# Patient Record
Sex: Female | Born: 1980 | Race: Black or African American | Hispanic: No | Smoking: Never smoker
Health system: Southern US, Community
[De-identification: ages and names within clinical notes are randomized; demographics above are authoritative.]

---

## 2015-07-20 ENCOUNTER — Ambulatory Visit: Payer: Self-pay | Admitting: Cardiology

## 2020-09-27 ENCOUNTER — Encounter (HOSPITAL_COMMUNITY): Payer: Self-pay

## 2020-09-27 ENCOUNTER — Emergency Department (HOSPITAL_COMMUNITY): Payer: Commercial Managed Care - PPO

## 2020-09-27 ENCOUNTER — Other Ambulatory Visit: Payer: Self-pay

## 2020-09-27 ENCOUNTER — Emergency Department (HOSPITAL_COMMUNITY)
Admission: EM | Admit: 2020-09-27 | Discharge: 2020-09-27 | Disposition: A | Discharge status: Home / Self Care | Payer: Commercial Managed Care - PPO | Attending: Emergency Medicine | Admitting: Emergency Medicine

## 2020-09-27 DIAGNOSIS — R599 Enlarged lymph nodes, unspecified: Secondary | ICD-10-CM | POA: Diagnosis not present

## 2020-09-27 DIAGNOSIS — J029 Acute pharyngitis, unspecified: Secondary | ICD-10-CM | POA: Diagnosis present

## 2020-09-27 DIAGNOSIS — J36 Peritonsillar abscess: Secondary | ICD-10-CM | POA: Insufficient documentation

## 2020-09-27 LAB — CBC WITH DIFFERENTIAL/PLATELET
Abs Immature Granulocytes: 0.05 10*3/uL (ref 0.00–0.07)
Basophils Absolute: 0 10*3/uL (ref 0.0–0.1)
Basophils Relative: 0 %
Eosinophils Absolute: 0 10*3/uL (ref 0.0–0.5)
Eosinophils Relative: 0 %
HCT: 40.2 % (ref 36.0–46.0)
Hemoglobin: 12.8 g/dL (ref 12.0–15.0)
Immature Granulocytes: 1 %
Lymphocytes Relative: 8 %
Lymphs Abs: 0.6 10*3/uL — ABNORMAL LOW (ref 0.7–4.0)
MCH: 25.3 pg — ABNORMAL LOW (ref 26.0–34.0)
MCHC: 31.8 g/dL (ref 30.0–36.0)
MCV: 79.4 fL — ABNORMAL LOW (ref 80.0–100.0)
Monocytes Absolute: 0.5 10*3/uL (ref 0.1–1.0)
Monocytes Relative: 6 %
Neutro Abs: 7.1 10*3/uL (ref 1.7–7.7)
Neutrophils Relative %: 85 %
Platelets: 321 10*3/uL (ref 150–400)
RBC: 5.06 MIL/uL (ref 3.87–5.11)
RDW: 14.1 % (ref 11.5–15.5)
WBC: 8.4 10*3/uL (ref 4.0–10.5)
nRBC: 0 % (ref 0.0–0.2)

## 2020-09-27 LAB — BASIC METABOLIC PANEL
Anion gap: 9 (ref 5–15)
BUN: 10 mg/dL (ref 6–20)
CO2: 20 mmol/L — ABNORMAL LOW (ref 22–32)
Calcium: 8.4 mg/dL — ABNORMAL LOW (ref 8.9–10.3)
Chloride: 103 mmol/L (ref 98–111)
Creatinine, Ser: 0.98 mg/dL (ref 0.44–1.00)
GFR, Estimated: >60 mL/min (ref >60)
Glucose, Bld: 95 mg/dL (ref 70–99)
Potassium: 4.2 mmol/L (ref 3.5–5.1)
Sodium: 132 mmol/L — ABNORMAL LOW (ref 135–145)

## 2020-09-27 LAB — GROUP A STREP BY PCR: Group A Strep by PCR: DETECTED — AB

## 2020-09-27 LAB — HCG, SERUM, QUALITATIVE: Preg, Serum: NEGATIVE

## 2020-09-27 MED ORDER — DEXAMETHASONE SODIUM PHOSPHATE 10 MG/ML IJ SOLN
10.0000 mg | Freq: Once | INTRAMUSCULAR | Status: AC
  Administered 2020-09-27: 10 mg via INTRAVENOUS
  Filled 2020-09-27: qty 1

## 2020-09-27 MED ORDER — IBUPROFEN 800 MG PO TABS
800.0000 mg | ORAL_TABLET | Freq: Once | ORAL | Status: AC
  Administered 2020-09-27: 800 mg via ORAL
  Filled 2020-09-27: qty 1

## 2020-09-27 MED ORDER — CLINDAMYCIN PHOSPHATE 600 MG/50ML IV SOLN
600.0000 mg | Freq: Once | INTRAVENOUS | Status: AC
  Administered 2020-09-27: 600 mg via INTRAVENOUS
  Filled 2020-09-27: qty 50

## 2020-09-27 MED ORDER — IOHEXOL 300 MG/ML  SOLN
75.0000 mL | Freq: Once | INTRAMUSCULAR | Status: AC | PRN
  Administered 2020-09-27: 75 mL via INTRAVENOUS

## 2020-09-27 MED ORDER — CLINDAMYCIN HCL 300 MG PO CAPS: 300.0000 mg | ORAL_CAPSULE | Freq: Three times a day (TID) | ORAL | 0 refills | Status: AC

## 2020-09-27 NOTE — Discharge Instructions (Addendum)
You were seen in the emergency department for your sore throat.  Your CT scan was very reassuring.  It appears you have a bacterial throat infection causing your sore throat.  You administered your first dose of antibiotics in the emergency department, and you were given a prescription for antibiotics. Please take the antibiotic prescription fully.   Please follow up with the ear nose and throat doctor listed below within 5-7 days for re-evaluation of your symptoms.  Please return to the emergency department for any new or worsening symptoms.

## 2020-09-27 NOTE — ED Provider Notes (Signed)
[facility] EMERGENCY DEPARTMENT Provider Note   CSN: [id number] Arrival date & time: 09/27/20  1653     History Chief Complaint  Patient presents with  . Facial Swelling    Tiffany Lloyd is a 40 y.o. female.  HPI   40 year old female presenting the emergency department today for evaluation of sore throat.  For the last 2 days has had pain to the left side of her throat and later developed some swelling to the left side of her neck.  She reports associated fevers.  History reviewed. No pertinent past medical history.  There are no problems to display for this patient.   History reviewed. No pertinent surgical history.   OB History   No obstetric history on file.     No family history on file.  Social History   Tobacco Use  . Smoking status: Never Smoker  . Smokeless tobacco: Never Used  Substance Use Topics  . Alcohol use: Yes    Alcohol/week: 3.0 standard drinks    Types: 3 Glasses of wine per week  . Drug use: Never    Home Medications Prior to Admission medications   Medication Sig Start Date End Date Taking? Authorizing Provider  clindamycin (CLEOCIN) 300 MG capsule Take 1 capsule (300 mg total) by mouth 3 (three) times daily for 7 days. 09/27/20 10/04/20 Yes Brit Wernette S, PA-C    Allergies    Amoxicillin  Review of Systems   Review of Systems  Constitutional: Positive for fever.  HENT: Positive for sore throat.   Eyes: Negative for visual disturbance.  Respiratory: Negative for shortness of breath.   Cardiovascular: Negative for chest pain.  Gastrointestinal: Negative for abdominal pain, diarrhea, nausea and vomiting.  Genitourinary: Negative for pelvic pain.  Musculoskeletal: Negative for back pain.  Neurological: Negative for headaches.    Physical Exam Updated Vital Signs BP (!) 142/88 (BP Location: Left Arm)   Pulse 98   Temp 99.5 F (37.5 C) (Oral)   Resp 16   Ht 5\' 2"  (1.575 m)   Wt 72.6 kg   LMP 09/23/2020   SpO2 100%   BMI  29.26 kg/m   Physical Exam Vitals and nursing note reviewed.  Constitutional:      General: She is not in acute distress.    Appearance: She is well-developed.  HENT:     Head: Normocephalic and atraumatic.     Mouth/Throat:     Pharynx: Uvula midline. Pharyngeal swelling and posterior oropharyngeal erythema present. No oropharyngeal exudate.     Tonsils: No tonsillar exudate. 3+ on the right. 3+ on the left.  Eyes:     Conjunctiva/sclera: Conjunctivae normal.  Cardiovascular:     Rate and Rhythm: Normal rate and regular rhythm.     Heart sounds: No murmur heard.   Pulmonary:     Effort: Pulmonary effort is normal. No respiratory distress.     Breath sounds: Normal breath sounds.  Abdominal:     Palpations: Abdomen is soft.     Tenderness: There is no abdominal tenderness.  Musculoskeletal:     Cervical back: Neck supple.  Lymphadenopathy:     Cervical: Cervical adenopathy present.  Skin:    General: Skin is warm and dry.  Neurological:     Mental Status: She is alert.     ED Results / Procedures / Treatments   Labs (all labs ordered are listed, but only abnormal results are displayed) Labs Reviewed  CBC WITH DIFFERENTIAL/PLATELET - Abnormal; Notable for the following  components:      Result Value   MCV 79.4 (*)    MCH 25.3 (*)    Lymphs Abs 0.6 (*)    All other components within normal limits  BASIC METABOLIC PANEL - Abnormal; Notable for the following components:   Sodium 132 (*)    CO2 20 (*)    Calcium 8.4 (*)    All other components within normal limits  GROUP A STREP BY PCR  HCG, SERUM, QUALITATIVE    EKG None  Radiology No results found.  Procedures Procedures   Medications Ordered in ED Medications  dexamethasone (DECADRON) injection 10 mg (has no administration in time range)  clindamycin (CLEOCIN) IVPB 600 mg (has no administration in time range)  iohexol (OMNIPAQUE) 300 MG/ML solution 75 mL (75 mLs Intravenous Contrast Given 09/27/20  2038)    ED Course  I have reviewed the triage vital signs and the nursing notes.  Pertinent labs & imaging results that were available during my care of the patient were reviewed by me and considered in my medical decision making (see chart for details).    MDM Rules/Calculators/A&P                          40 year old female presenting for evaluation of sore throat.  Does have bilateral tonsillar erythema, edema but intermittent tenderness and swelling noted to the left side of the neck consistent with cervical lymphadenopathy.  Will obtain labs, CT soft tissue neck to further eval for deep space infection.  Reviewed/interpreted labs CBC with no leukocytosis or anemia BMP with mild hyponatremia, otherwise unremarkable Pregnancy test  Negative  At shift change, pt pending CT soft tissue neck and strep test. Care transitioned to Loleta Dicker, PA-C with plan to f/u on w/u.    Final Clinical Impression(s) / ED Diagnoses Final diagnoses:  Peritonsillar abscess    Rx / DC Orders ED Discharge Orders         Ordered    clindamycin (CLEOCIN) 300 MG capsule  3 times daily        09/27/20 2052           Rayne Du 09/27/20 2052    Bethann Berkshire, MD 09/27/20 2237

## 2020-09-27 NOTE — ED Triage Notes (Signed)
Pt presents to ED with fever, Sore throat and more painful on the left side, Large nodule to left side of neck.( 9cm x 4cm).  Airway patent but left side swollen.

## 2020-09-27 NOTE — ED Provider Notes (Signed)
  Physical Exam  BP (!) 142/88 (BP Location: Left Arm)   Pulse 98   Temp 99.5 F (37.5 C) (Oral)   Resp 16   Ht 5\' 2"  (1.575 m)   Wt 72.6 kg   LMP 09/23/2020   SpO2 100%   BMI 29.26 kg/m   Physical Exam  ED Course/Procedures     Procedures  MDM    Care of this patient assumed from preceding ED provider, Cortni Couture, PA-C at time of shift change.  Please see her associated note for further details of patient's ED course.  In brief patient is a 40 year old female presents emergency department for 2 days of sore throat and swelling to the left side of her neck with associated fevers.    CBC unremarkable, BMP with mild hyponatremia 132.  Patient is not pregnant.    At time of shift change patient is awaiting CT soft tissues of the neck and strep test.  Should CT scan be negative for acute peritonsillar abscess, patient may be discharged home on clindamycin with ENT follow-up.  CT scan of the neck revealed left greater than right palatine tonsillar edema consistent with acute tonsillopharyngitis.  Multiple enlarged left cervical lymph nodes likely reactive.  Clinical follow-up recommended once symptoms have resolved.  Group A strep pharyngitis test remains pending at this time due to delay in the lab.  Patient may follow-up on her test results in the MyChart app as she will go home on antibiotic therapy regardless.  No further work-up is warranted in the ED at this time.  Patient will be discharged home with course of clindamycin and ENT follow-up.  Tiffany Lloyd voiced understanding of her medical evaluation and treatment plan.  Each of her questions was answered to her expressed satisfaction.  Return precautions given.  Patient is stable and appropriate for discharge at this time.  This chart was dictated using voice recognition software, Dragon. Despite the best efforts of this provider to proofread and correct errors, errors may still occur which can change documentation  meaning.     Tiffany Lloyd 09/27/20 2157    [id number], MD 09/27/20 2237

## 2020-09-27 NOTE — ED Notes (Signed)
Nurse attempted IV x 2.

## 2020-09-27 NOTE — ED Notes (Signed)
Loleta Dicker, PA, made aware of patient's temp. Awaiting orders.

## 2023-03-30 IMAGING — CT CT NECK W/ CM
3 of 4 series · 13 of 33 positions shown, 16 images · IV contrast (Omnipaque or Isovue)
Comparison: None.

CLINICAL DATA: Fever with sore throat

EXAM:
CT NECK WITH CONTRAST
TECHNIQUE: Multidetector CT imaging of the neck was performed using the
standard protocol following the bolus administration of intravenous
contrast.
CONTRAST:  75mL OMNIPAQUE IOHEXOL 300 MG/ML  SOLN

[Series 4: cor neck · coronal · 0.32mm/px · 3 of 121 slices shown]
[im 29/121  bone]
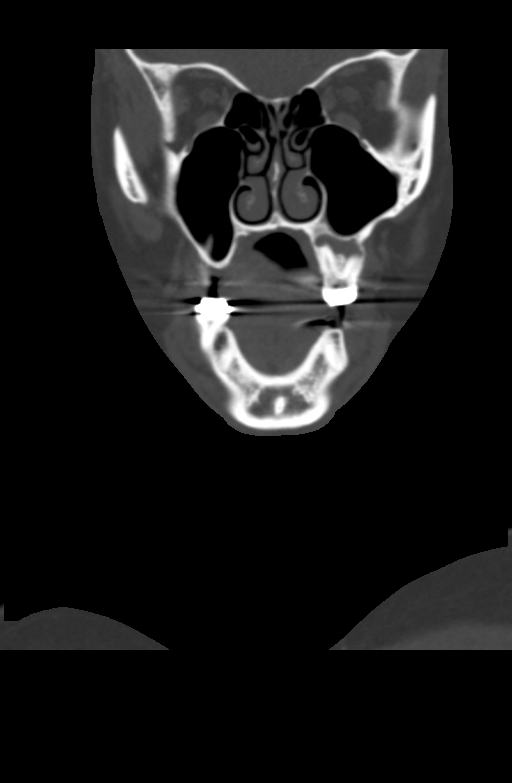
[im 50/121  bone]
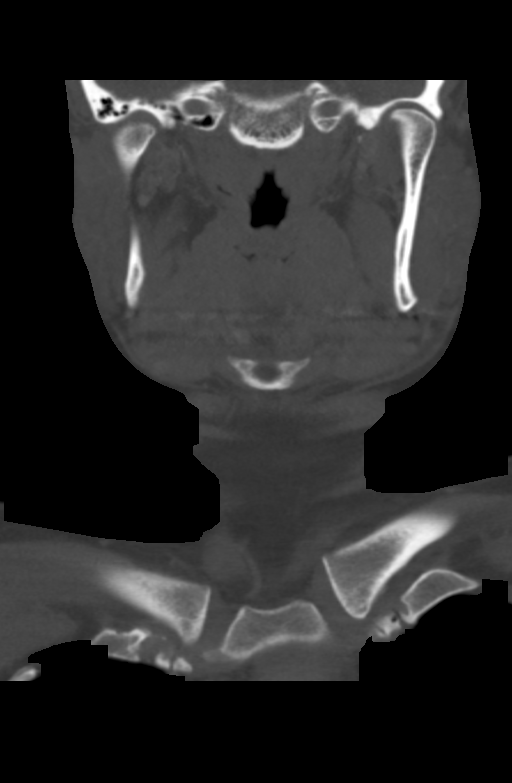
[im 71/121  bone]
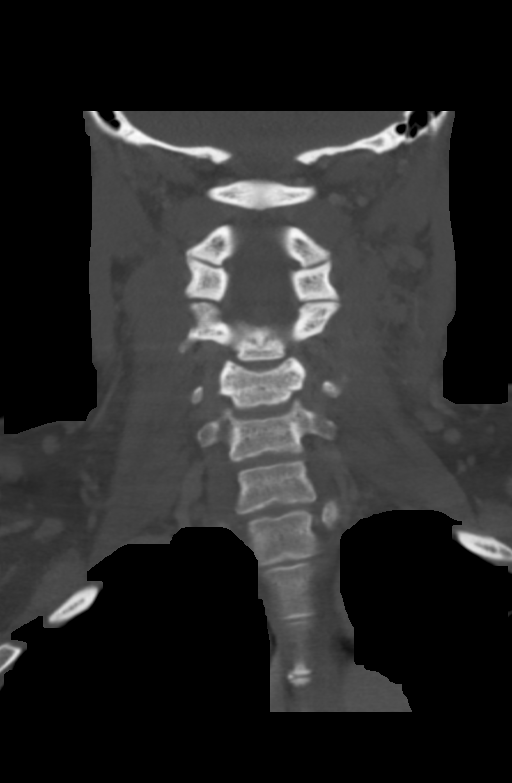

[Series 5: sag neck · sagittal · 0.49mm/px · 5 of 91 slices shown, 6 images]
[im 31/91  bone]
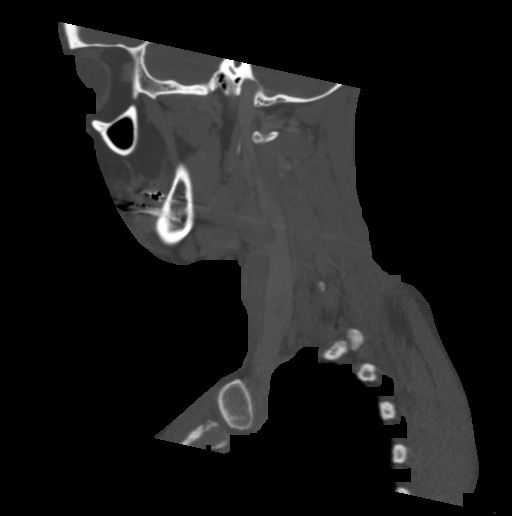
[im 38/91  bone]
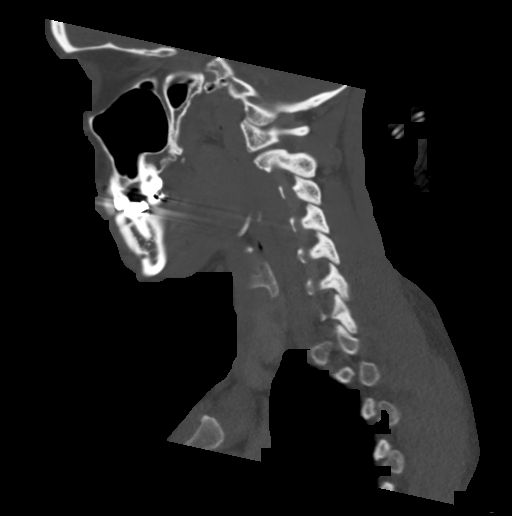
[im 46/91  soft-tissue]
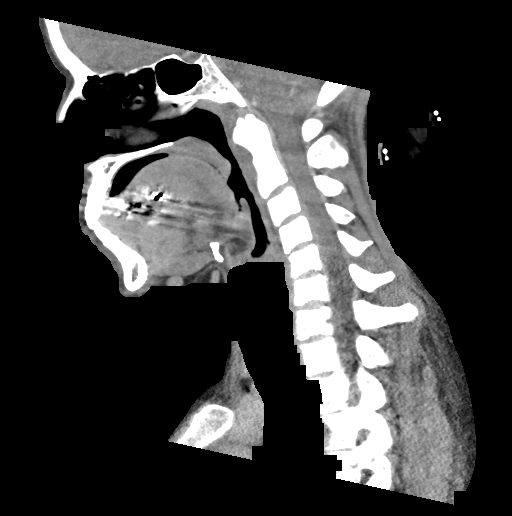
[im 46/91  bone]
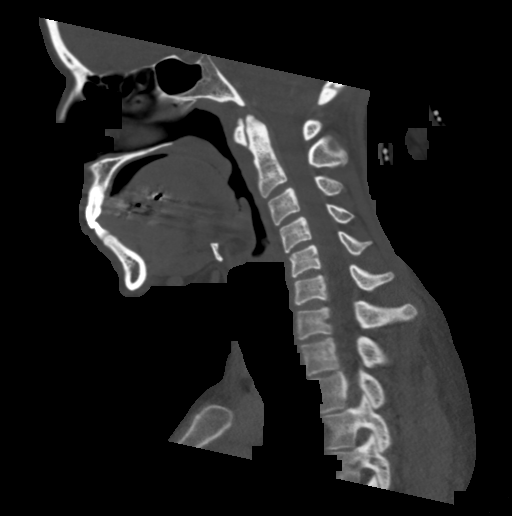
[im 53/91  bone]
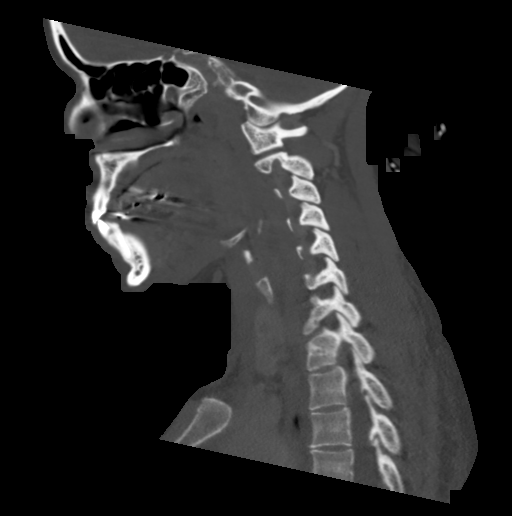
[im 61/91  bone]
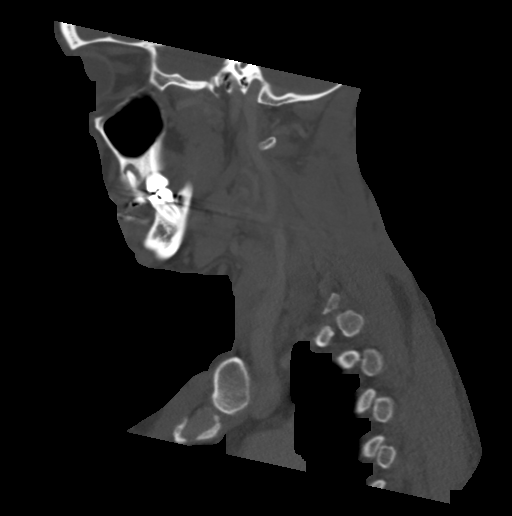

[Series 6: ax oropharynx · axial · 0.39mm/px · z∈[+1048,+1206]mm · 5 of 112 slices shown, 7 images]
[im 16/112  soft-tissue]
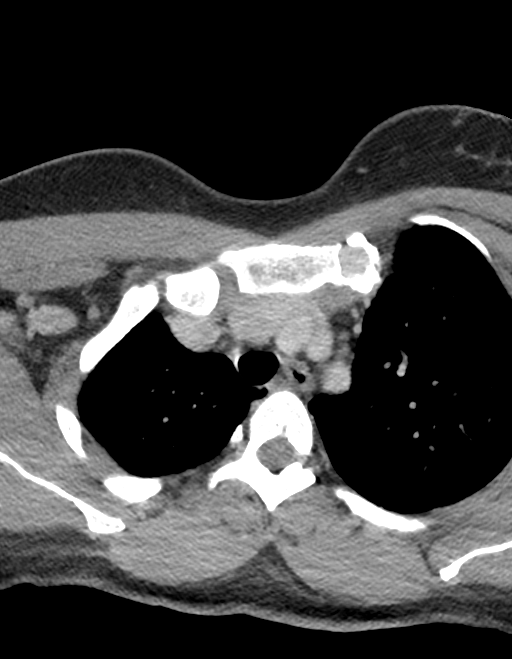
[im 16/112  bone]
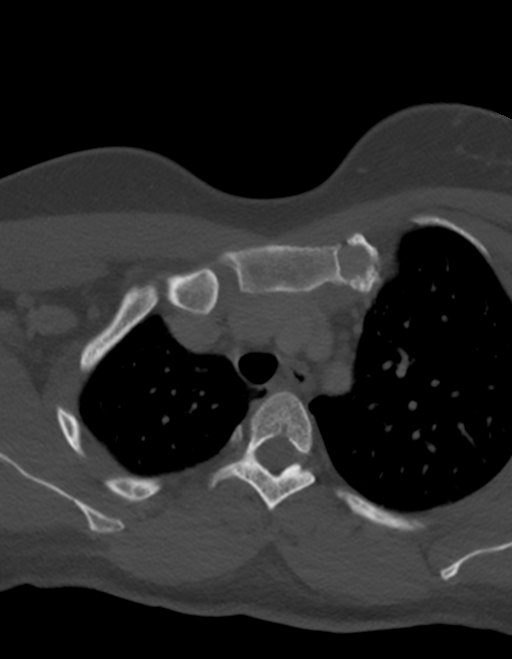
[im 32/112  bone]
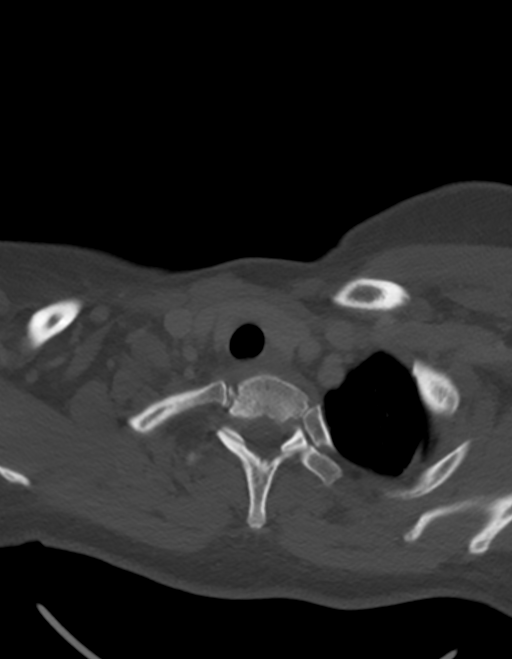
[im 64/112  bone]
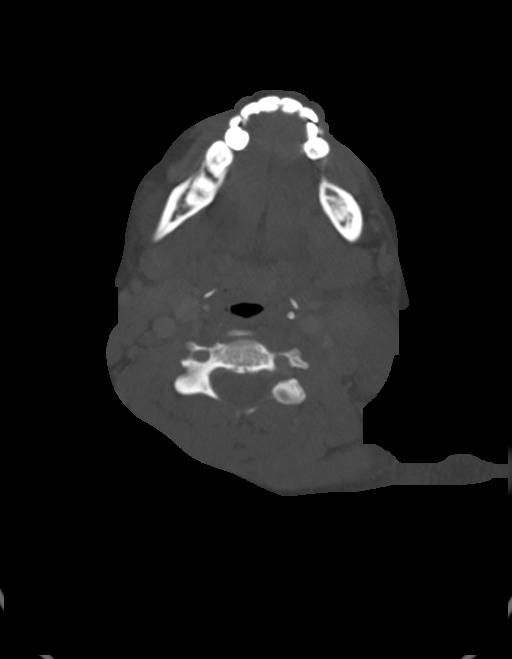
[im 80/112  bone]
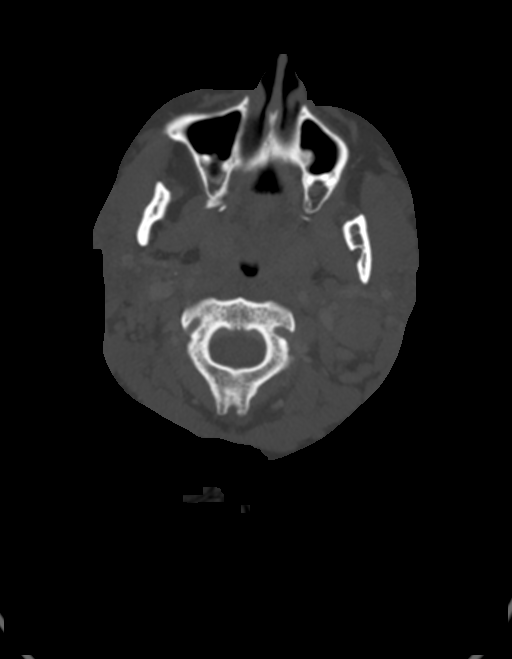
[im 96/112  soft-tissue]
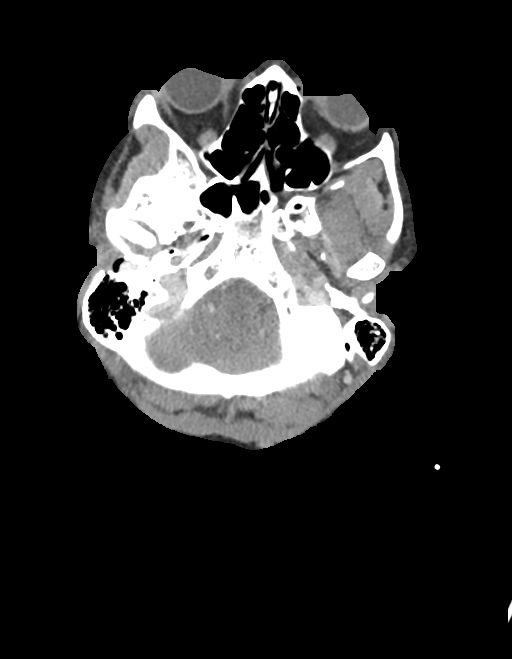
[im 96/112  bone]
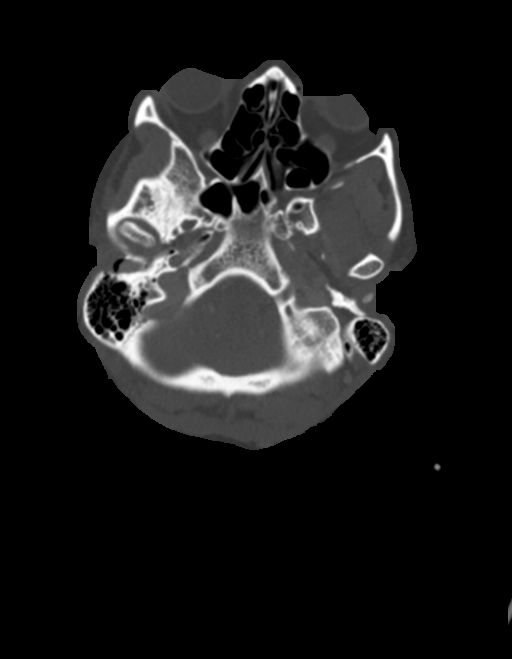

[13 of 33 positions shown; findings below may reference images not displayed]

FINDINGS: Pharynx and larynx: Left-greater-than-right palatine tonsillar
edema. Epiglottis is normal. No laryngeal abnormality.

Salivary glands: Normal

Thyroid: Unremarkable

Lymph nodes: Multiple enlarged left cervical lymph nodes measuring
up to 1.8 cm.

Vascular: Negative.

Limited intracranial: Negative.

Visualized orbits: Negative.

Mastoids and visualized paranasal sinuses: Negative

Skeleton: No acute or aggressive process.

Upper chest: Negative.

Other: None.
IMPRESSION: 1. Left-greater-than-right palatine tonsillar edema, consistent with
acute tonsillopharyngitis.
2. Multiple enlarged left cervical lymph nodes, likely reactive.
Clinical follow-up recommended when symptoms have resolved to ensure
resolution of adenopathy.
